# Patient Record
Sex: Male | Born: 1985 | Race: White | Hispanic: Yes | Marital: Single | Smoking: Current some day smoker
Health system: Southern US, Community
[De-identification: ages and names within clinical notes are randomized; demographics above are authoritative.]

## PROBLEM LIST (undated history)

## (undated) HISTORY — PX: TONSILLECTOMY: SUR1361

---

## 2019-03-11 ENCOUNTER — Emergency Department (HOSPITAL_COMMUNITY): Payer: No Typology Code available for payment source

## 2019-03-11 ENCOUNTER — Encounter (HOSPITAL_COMMUNITY): Payer: Self-pay | Admitting: Emergency Medicine

## 2019-03-11 ENCOUNTER — Observation Stay (HOSPITAL_COMMUNITY)
Admission: EM | Admit: 2019-03-11 | Discharge: 2019-03-12 | Disposition: A | Discharge status: Home / Self Care | Payer: No Typology Code available for payment source | Attending: Family Medicine | Admitting: Family Medicine

## 2019-03-11 DIAGNOSIS — J9601 Acute respiratory failure with hypoxia: Principal | ICD-10-CM | POA: Insufficient documentation

## 2019-03-11 DIAGNOSIS — F10129 Alcohol abuse with intoxication, unspecified: Secondary | ICD-10-CM | POA: Insufficient documentation

## 2019-03-11 DIAGNOSIS — F1410 Cocaine abuse, uncomplicated: Secondary | ICD-10-CM | POA: Insufficient documentation

## 2019-03-11 DIAGNOSIS — Y906 Blood alcohol level of 120-199 mg/100 ml: Secondary | ICD-10-CM | POA: Diagnosis not present

## 2019-03-11 DIAGNOSIS — R739 Hyperglycemia, unspecified: Secondary | ICD-10-CM | POA: Diagnosis not present

## 2019-03-11 DIAGNOSIS — Z20822 Contact with and (suspected) exposure to covid-19: Secondary | ICD-10-CM | POA: Diagnosis not present

## 2019-03-11 DIAGNOSIS — J690 Pneumonitis due to inhalation of food and vomit: Secondary | ICD-10-CM | POA: Diagnosis not present

## 2019-03-11 DIAGNOSIS — F10929 Alcohol use, unspecified with intoxication, unspecified: Secondary | ICD-10-CM

## 2019-03-11 DIAGNOSIS — R7989 Other specified abnormal findings of blood chemistry: Secondary | ICD-10-CM | POA: Diagnosis not present

## 2019-03-11 LAB — COMPREHENSIVE METABOLIC PANEL
ALT: 237 U/L — ABNORMAL HIGH (ref 0–44)
AST: 169 U/L — ABNORMAL HIGH (ref 15–41)
Albumin: 4.4 g/dL (ref 3.5–5.0)
Alkaline Phosphatase: 80 U/L (ref 38–126)
Anion gap: 17 — ABNORMAL HIGH (ref 5–15)
BUN: 9 mg/dL (ref 6–20)
CO2: 17 mmol/L — ABNORMAL LOW (ref 22–32)
Calcium: 8.9 mg/dL (ref 8.9–10.3)
Chloride: 106 mmol/L (ref 98–111)
Creatinine, Ser: 0.81 mg/dL (ref 0.61–1.24)
GFR calc Af Amer: >60 mL/min (ref >60)
GFR calc non Af Amer: >60 mL/min (ref >60)
Glucose, Bld: 198 mg/dL — ABNORMAL HIGH (ref 70–99)
Potassium: 3.5 mmol/L (ref 3.5–5.1)
Sodium: 140 mmol/L (ref 135–145)
Total Bilirubin: 0.7 mg/dL (ref 0.3–1.2)
Total Protein: 7.7 g/dL (ref 6.5–8.1)

## 2019-03-11 LAB — CBC
HCT: 45.2 % (ref 39.0–52.0)
Hemoglobin: 14.6 g/dL (ref 13.0–17.0)
MCH: 26.5 pg (ref 26.0–34.0)
MCHC: 32.3 g/dL (ref 30.0–36.0)
MCV: 82.2 fL (ref 80.0–100.0)
Platelets: 321 10*3/uL (ref 150–400)
RBC: 5.5 MIL/uL (ref 4.22–5.81)
RDW: 14.2 % (ref 11.5–15.5)
WBC: 14.2 10*3/uL — ABNORMAL HIGH (ref 4.0–10.5)
nRBC: 0 % (ref 0.0–0.2)

## 2019-03-11 LAB — URINALYSIS, ROUTINE W REFLEX MICROSCOPIC
Bilirubin Urine: NEGATIVE
Glucose, UA: 150 mg/dL — AB
Hgb urine dipstick: NEGATIVE
Ketones, ur: NEGATIVE mg/dL
Leukocytes,Ua: NEGATIVE
Nitrite: NEGATIVE
Protein, ur: NEGATIVE mg/dL
Specific Gravity, Urine: 1.008 (ref 1.005–1.030)
pH: 6 (ref 5.0–8.0)

## 2019-03-11 LAB — CDS SEROLOGY

## 2019-03-11 LAB — SAMPLE TO BLOOD BANK

## 2019-03-11 LAB — ETHANOL: Alcohol, Ethyl (B): 164 mg/dL — ABNORMAL HIGH (ref <10)

## 2019-03-11 MED ORDER — ONDANSETRON HCL 4 MG/2ML IJ SOLN
4.0000 mg | Freq: Once | INTRAMUSCULAR | Status: AC
  Administered 2019-03-11: 4 mg via INTRAVENOUS
  Filled 2019-03-11: qty 2

## 2019-03-11 MED ORDER — LORAZEPAM 2 MG/ML IJ SOLN
1.0000 mg | Freq: Once | INTRAMUSCULAR | Status: AC
  Administered 2019-03-11: 1 mg via INTRAVENOUS
  Filled 2019-03-11: qty 1

## 2019-03-11 MED ORDER — NALOXONE HCL 4 MG/10ML IJ SOLN
0.2500 mg/h | INTRAVENOUS | Status: DC
  Administered 2019-03-11: 0.5 mg/h via INTRAVENOUS
  Filled 2019-03-11: qty 10

## 2019-03-11 MED ORDER — SODIUM CHLORIDE 0.9 % IV BOLUS
1000.0000 mL | Freq: Once | INTRAVENOUS | Status: AC
  Administered 2019-03-11: 19:00:00 1000 mL via INTRAVENOUS

## 2019-03-11 MED ORDER — NALOXONE HCL 2 MG/2ML IJ SOSY
PREFILLED_SYRINGE | INTRAMUSCULAR | Status: AC
  Filled 2019-03-11: qty 2

## 2019-03-11 MED ORDER — IOHEXOL 300 MG/ML  SOLN
100.0000 mL | Freq: Once | INTRAMUSCULAR | Status: AC | PRN
  Administered 2019-03-11: 100 mL via INTRAVENOUS

## 2019-03-11 NOTE — ED Notes (Signed)
RT and pharmacy called to bedside, after narcan drip started, alert enough to maintain airway on NRB at 15L. Wife called to notify of presence in ED to encourage pt compliance, refused and would push away staff trying to provide care otherwise.
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
## 2019-03-11 NOTE — ED Notes (Signed)
Pt refused blood draw for blood bank until wife arrives

## 2019-03-11 NOTE — ED Notes (Signed)
Pt returned to NRB at 15, sat 89% on 4L [place], now 99%

## 2019-03-11 NOTE — ED Notes (Signed)
EDP at bedside, noted that pt not adequated potecting airway, narcan 2mg  ordered and given while EDP used bag/mask to oxygenate. Nasal trumpet placed. After narcan given, pt began to vomit, placed on side and 4mg  zofran given. Pt alert enough to assist with rolling and pull out nasal trumpet. Vomited x2 prior to and x3 after zofran.

## 2019-03-11 NOTE — ED Notes (Signed)
Per MD order Narcan drip held and Pt to be monitored.

## 2019-03-11 NOTE — ED Notes (Signed)
Still off floor for imaging

## 2019-03-12 ENCOUNTER — Ambulatory Visit (HOSPITAL_BASED_OUTPATIENT_CLINIC_OR_DEPARTMENT_OTHER): Payer: No Typology Code available for payment source

## 2019-03-12 ENCOUNTER — Other Ambulatory Visit: Payer: Self-pay

## 2019-03-12 ENCOUNTER — Encounter (HOSPITAL_COMMUNITY): Payer: Self-pay | Admitting: Internal Medicine

## 2019-03-12 DIAGNOSIS — J690 Pneumonitis due to inhalation of food and vomit: Secondary | ICD-10-CM

## 2019-03-12 DIAGNOSIS — R55 Syncope and collapse: Secondary | ICD-10-CM

## 2019-03-12 DIAGNOSIS — J9601 Acute respiratory failure with hypoxia: Principal | ICD-10-CM

## 2019-03-12 LAB — CBC
HCT: 41.7 % (ref 39.0–52.0)
Hemoglobin: 14 g/dL (ref 13.0–17.0)
MCH: 27.3 pg (ref 26.0–34.0)
MCHC: 33.6 g/dL (ref 30.0–36.0)
MCV: 81.3 fL (ref 80.0–100.0)
Platelets: 290 10*3/uL (ref 150–400)
RBC: 5.13 MIL/uL (ref 4.22–5.81)
RDW: 14.6 % (ref 11.5–15.5)
WBC: 20.5 10*3/uL — ABNORMAL HIGH (ref 4.0–10.5)
nRBC: 0 % (ref 0.0–0.2)

## 2019-03-12 LAB — I-STAT CHEM 8, ED
BUN: 9 mg/dL (ref 6–20)
Calcium, Ion: 1 mmol/L — ABNORMAL LOW (ref 1.15–1.40)
Chloride: 107 mmol/L (ref 98–111)
Creatinine, Ser: 1 mg/dL (ref 0.61–1.24)
Glucose, Bld: 208 mg/dL — ABNORMAL HIGH (ref 70–99)
HCT: 44 % (ref 39.0–52.0)
Hemoglobin: 15 g/dL (ref 13.0–17.0)
Potassium: 3.6 mmol/L (ref 3.5–5.1)
Sodium: 140 mmol/L (ref 135–145)
TCO2: 20 mmol/L — ABNORMAL LOW (ref 22–32)

## 2019-03-12 LAB — RAPID URINE DRUG SCREEN, HOSP PERFORMED
Amphetamines: NOT DETECTED
Barbiturates: NOT DETECTED
Benzodiazepines: NOT DETECTED
Cocaine: POSITIVE — AB
Opiates: NOT DETECTED
Tetrahydrocannabinol: NOT DETECTED

## 2019-03-12 LAB — TROPONIN I (HIGH SENSITIVITY)
Troponin I (High Sensitivity): 8 ng/L (ref <18)
Troponin I (High Sensitivity): 8 ng/L (ref <18)

## 2019-03-12 LAB — COMPREHENSIVE METABOLIC PANEL
ALT: 197 U/L — ABNORMAL HIGH (ref 0–44)
AST: 93 U/L — ABNORMAL HIGH (ref 15–41)
Albumin: 4.2 g/dL (ref 3.5–5.0)
Alkaline Phosphatase: 69 U/L (ref 38–126)
Anion gap: 14 (ref 5–15)
BUN: 9 mg/dL (ref 6–20)
CO2: 22 mmol/L (ref 22–32)
Calcium: 8.7 mg/dL — ABNORMAL LOW (ref 8.9–10.3)
Chloride: 103 mmol/L (ref 98–111)
Creatinine, Ser: 0.94 mg/dL (ref 0.61–1.24)
GFR calc Af Amer: >60 mL/min (ref >60)
GFR calc non Af Amer: >60 mL/min (ref >60)
Glucose, Bld: 137 mg/dL — ABNORMAL HIGH (ref 70–99)
Potassium: 4.2 mmol/L (ref 3.5–5.1)
Sodium: 139 mmol/L (ref 135–145)
Total Bilirubin: 0.8 mg/dL (ref 0.3–1.2)
Total Protein: 7.9 g/dL (ref 6.5–8.1)

## 2019-03-12 LAB — HEPATITIS PANEL, ACUTE
HCV Ab: NONREACTIVE
Hep A IgM: NONREACTIVE
Hep B C IgM: NONREACTIVE
Hepatitis B Surface Ag: NONREACTIVE

## 2019-03-12 LAB — HEMOGLOBIN A1C
Hgb A1c MFr Bld: 5.2 % (ref 4.8–5.6)
Mean Plasma Glucose: 102.54 mg/dL

## 2019-03-12 LAB — TSH: TSH: 0.591 u[IU]/mL (ref 0.350–4.500)

## 2019-03-12 LAB — ECHOCARDIOGRAM COMPLETE: Height: 68 in

## 2019-03-12 LAB — PHOSPHORUS: Phosphorus: 4.5 mg/dL (ref 2.5–4.6)

## 2019-03-12 LAB — MAGNESIUM
Magnesium: 1.6 mg/dL — ABNORMAL LOW (ref 1.7–2.4)
Magnesium: 2.4 mg/dL (ref 1.7–2.4)

## 2019-03-12 LAB — RESPIRATORY PANEL BY RT PCR (FLU A&B, COVID)
Influenza A by PCR: NEGATIVE
Influenza B by PCR: NEGATIVE
SARS Coronavirus 2 by RT PCR: NEGATIVE

## 2019-03-12 LAB — HIV ANTIBODY (ROUTINE TESTING W REFLEX): HIV Screen 4th Generation wRfx: NONREACTIVE

## 2019-03-12 MED ORDER — LORAZEPAM 2 MG/ML IJ SOLN: 1.0000 mg | INTRAMUSCULAR | Status: DC | PRN

## 2019-03-12 MED ORDER — ADULT MULTIVITAMIN W/MINERALS CH
1.0000 | ORAL_TABLET | Freq: Every day | ORAL | Status: DC
  Administered 2019-03-12: 1 via ORAL
  Filled 2019-03-12: qty 1

## 2019-03-12 MED ORDER — SODIUM CHLORIDE 0.9 % IV SOLN
1.5000 g | Freq: Four times a day (QID) | INTRAVENOUS | Status: DC
  Administered 2019-03-12 (×2): 1.5 g via INTRAVENOUS
  Filled 2019-03-12 (×2): qty 4
  Filled 2019-03-12: qty 1.5
  Filled 2019-03-12 (×2): qty 4

## 2019-03-12 MED ORDER — AMOXICILLIN-POT CLAVULANATE 875-125 MG PO TABS: 1.0000 | ORAL_TABLET | Freq: Two times a day (BID) | ORAL | 0 refills | Status: AC

## 2019-03-12 MED ORDER — ACETAMINOPHEN 650 MG RE SUPP: 650.0000 mg | Freq: Four times a day (QID) | RECTAL | Status: DC | PRN

## 2019-03-12 MED ORDER — ACETAMINOPHEN 325 MG PO TABS
650.0000 mg | ORAL_TABLET | Freq: Four times a day (QID) | ORAL | Status: DC | PRN
  Administered 2019-03-12: 650 mg via ORAL
  Filled 2019-03-12 (×2): qty 2

## 2019-03-12 MED ORDER — THIAMINE HCL 100 MG PO TABS: 100.0000 mg | ORAL_TABLET | Freq: Every day | ORAL | 1 refills | Status: AC

## 2019-03-12 MED ORDER — FOLIC ACID 1 MG PO TABS
1.0000 mg | ORAL_TABLET | Freq: Every day | ORAL | Status: DC
  Administered 2019-03-12: 1 mg via ORAL
  Filled 2019-03-12: qty 1

## 2019-03-12 MED ORDER — THIAMINE HCL 100 MG PO TABS
100.0000 mg | ORAL_TABLET | Freq: Every day | ORAL | Status: DC
  Administered 2019-03-12: 100 mg via ORAL
  Filled 2019-03-12: qty 1

## 2019-03-12 MED ORDER — MAGNESIUM SULFATE 2 GM/50ML IV SOLN
2.0000 g | Freq: Once | INTRAVENOUS | Status: AC
  Administered 2019-03-12: 2 g via INTRAVENOUS
  Filled 2019-03-12: qty 50

## 2019-03-12 MED ORDER — THIAMINE HCL 100 MG/ML IJ SOLN: 100.0000 mg | Freq: Every day | INTRAMUSCULAR | Status: DC

## 2019-03-12 MED ORDER — LORAZEPAM 1 MG PO TABS
1.0000 mg | ORAL_TABLET | ORAL | Status: DC | PRN
  Administered 2019-03-12: 1 mg via ORAL
  Filled 2019-03-12: qty 1

## 2019-03-12 MED ORDER — ALBUTEROL SULFATE HFA 108 (90 BASE) MCG/ACT IN AERS: 2.0000 | INHALATION_SPRAY | Freq: Four times a day (QID) | RESPIRATORY_TRACT | Status: DC | PRN
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
## 2019-03-12 NOTE — Progress Notes (Signed)
  Echocardiogram 2D Echocardiogram has been performed.  Celene Skeen 03/12/2019, 11:36 AM

## 2019-03-12 NOTE — ED Notes (Signed)
Patient's SPO2 was noted at 88 with good pleth. 2 liters O2 nasal cannula applied

## 2019-03-12 NOTE — ED Notes (Signed)
Patient called out complaining of pain at the IV site in the right [facility] where magnesium was being administered. Site was bruised and painful. IV was removed and the second IV site was used to administer the medication. The patient called out again stating he was having the same pain. A bubble was noted at the IV site and the IV was removed. Another IV was initiated to complete the therapy.

## 2019-03-12 NOTE — ED Notes (Signed)
Pt requesting resources for alcohol treatment. Pt states he drinks 2 cases of beer a day when he is " really drinking" CIWA assessment 8
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
## 2019-03-12 NOTE — ED Notes (Signed)
MD at bedside. Instructed to turn O2 off. Pt maintained 100% SpO2

## 2019-03-12 NOTE — Discharge Instructions (Signed)
Please reach out to one of the following agencies to request assistance with alcohol detox withdrawal and detox.  [facility] [phone]  [facility] [address] [place] [address] [phone]   [facility] [address] [phone]   Neumona por aspiracin Aspiration Pneumonia La neumona por aspiracin es una infeccin en los pulmones. Ocurre cuando se inhala (se aspira) saliva o lquido contaminados con bacterias hacia los pulmones. Cuando estas sustancias llegan a los pulmones, puede producirse hinchazn (inflamacin) e infeccin. Esto puede causar dificultad para respirar. La neumona por aspiracin es una afeccin grave que puede ser potencialmente mortal. Cules son las causas? Esta afeccin ocurre cuando se inhala saliva o lquido de la boca, de la garganta o del Nash-Finch Company, y cuando estos fluidos estn contaminados con bacterias. Qu incrementa el riesgo? Los siguientes factores pueden hacer que usted sea ms propenso a Warehouse manager esta afeccin:  Un estrechamiento en el conducto que transporta el alimento al estmago (estrechamiento esofgico).  Tener la enfermedad por reflujo gastroesofgico (ERGE).  Tener un sistema inmunitario debilitado.  Tener diabetes.  Tener una higiene bucal deficiente.  Estar desnutrido. Es ms probable que esta afeccin se produzca cuando hayan disminuido el reflejo de la tos (reflejo nauseoso) o la capacidad para tragar. Algunos factores que pueden causar esta disminucin incluyen los siguientes:  Tener una lesin cerebral o una enfermedad como accidente cerebrovascular, convulsiones, enfermedad de Parkinson, demencia o esclerosis lateral amiotrfica (ELA).  Recibir anestesia general por un procedimiento.  Beber alcohol en exceso. Si una persona se desmaya y vomita, el vmito se puede inhalar hacia los pulmones.  Tomar ciertos  medicamentos, como tranquilizantes o sedantes. Cules son los signos o los sntomas? Los sntomas de esta afeccin Baxter International siguientes:  [place].  Tos con secreciones que puedan ser [address], marrones o verdes.  Problemas respiratorios, como sibilancias o falta de aire.  Dolor en el pecho.  Sentir ms cansancio que lo habitual (fatiga).  Tener antecedentes de tos al comer o beber.  Mal aliento.  Color azulado en los labios, la piel o los dedos. Cmo se diagnostica? Esta afeccin se puede diagnosticar en funcin de lo siguiente:  Un examen fsico.  Estudios, como por ejemplo: ? Radiografa de trax. ? Cultivo de esputo. La saliva y la mucosidad (esputo) se Software engineer de los pulmones o de los conductos que llevan el aire a los pulmones (bronquios). Luego se buscan bacterias en el esputo. ? Oximetra. Se coloca un sensor o clip en reas como el dedo de la mano, el lbulo o el dedo del pie para medir el nivel de oxgeno en la [place]. ? Anlisis de [place]. ? Estudios de deglucin. En este estudio, se observa cmo se traga el alimento y si va hacia el conducto respiratorio (trquea) o al esfago. ? Broncoscopia. En este estudio, se [place] un tubo flexible (broncoscopio) para observar el interior de los pulmones. Cmo se trata? El tratamiento de esta afeccin puede incluir lo siguiente:  Medicamentos. Antibiticos administrados para eliminar la bacteria de la neumona. Tambin podrn indicarle otros medicamentos para disminuir la fiebre o Chief Technology Officer.  Asistencia respiratoria y oxigenoterapia. Segn qu tan bien respire, es posible que le administren oxgeno o que necesite la [place] de un dispositivo para Industrial/product designer (respirador).  Toracocentesis. Este es un procedimiento para extraer el lquido acumulado en el espacio que se encuentra entre las membranas de la pared torcica y los pulmones.  Sonda de alimentacin y cambios en la alimentacin. A las personas que tienen  dificultades para tragar podrn colocarle una sonda de alimentacin en el estmago, o bien podrn indicarle que eviten ciertas texturas en los alimentos o ciertos lquidos al comer. Siga estas indicaciones en su casa: Medicamentos  Delphi de venta libre y los recetados solamente como se lo haya indicado el mdico. ? Si le recetaron un antibitico, tmelo como se lo haya indicado el mdico. No deje de tomar los antibiticos aunque comience a Sports administrator. ? Delphi para la tos solamente si no puede dormir. Los medicamentos para la tos pueden obstaculizar la capacidad natural del organismo para eliminar la mucosidad de los pulmones. Instrucciones generales  Siga cuidadosamente todas las indicaciones relacionadas con la alimentacin, como evitar ciertas texturas de alimentos o espesar los lquidos. Espesar los lquidos reduce el riesgo de volver a sufrir una neumona por aspiracin.  Realice ejercicios de respiracin, como drenaje postural, respiracin profunda y espirometra incentiva, para expulsar las secreciones.  Haga reposo como se lo haya indicado el mdico.  De noche, duerma semisentado. Intente dormir en un silln reclinable o colquese algunas almohadas debajo de la cabeza.  No consuma ningn producto que contenga nicotina o tabaco, como cigarrillos y Psychologist, sport and exercise. Si necesita ayuda para dejar de fumar, consulte al mdico.  Concurra a todas las visitas de control como se lo haya indicado el mdico. Esto es importante. Comunquese con un mdico si:  Tiene fiebre.  La tos empeora con secreciones amarillentas, marrones o verdes.  Tiene tos al comer o beber. Solicite ayuda de inmediato si:  [place]ina dificultad para Ambulance person.  Siente dolor en el pecho. Resumen  La neumona por aspiracin es una infeccin en los pulmones. Es causada cuando se inhala saliva o lquido de la boca, de la garganta o del  [facility].  La neumona por aspiracin es ms probable que ocurra cuando hayan disminuido el reflejo de la tos o la capacidad para tragar.  Algunos sntomas de la neumona por aspiracin incluyen tos, problemas para respirar, fiebre y Tourist information centre manager.  La neumona por aspiracin puede tratarse con antibiticos, otros medicamentos para reducir Conservation officer, historic buildings o la fiebre y asistencia respiratoria u oxigenoterapia. Esta informacin no tiene Marine scientist el consejo del mdico. Asegrese de hacerle al mdico cualquier pregunta que tenga. Document Revised: 08/05/2016 Document Reviewed: 08/05/2016 Elsevier Patient Education  [place] por Ardelia Mems colisin entre vehculos motorizados en adultos Technical brewer Injury, Adult Despus de un accidente automovilstico (colisin entre vehculos motorizados), es comn tener lesiones en la cabeza, el [place], los brazos y el cuerpo. Estas lesiones pueden incluir:  Cortes.  Quemaduras.  Moretones.  Dolores musculares o una distensin o un desgarro en un msculo (esguince).  Dolores de [place]. Es posible que se sienta rgido y dolorido durante las primeras horas. Puede sentirse peor despus de despertarse la primera maana despus del accidente. Las BlueLinx y Conservation officer, historic buildings causados por estas lesiones suelen ser [employer] las primeras 24 a 48 horas. Despus de eso, comenzar a Charity fundraiser. La rapidez con la que mejore a menudo depende de lo siguiente:  La gravedad del accidente.  La cantidad de lesiones que tiene.  Dnde se encuentran sus lesiones.  Qu tipos de lesiones tiene.  Si estaba usando el cinturn de seguridad.  Si se [place] el airbag. Una lesin en la  cabeza puede dar lugar a una conmocin cerebral. Este es un tipo de lesin cerebral que puede tener Vassarefectos graves. Si tiene una conmocin cerebral, debe hacer reposo como se lo haya indicado el mdico. Debe tener mucho cuidado de  evitar una segunda conmocin cerebral. Siga estas instrucciones en su casa: Medicamentos  Use los medicamentos de venta libre y los recetados solamente como se lo haya indicado el mdico.  Si le recetaron un antibitico, tmelo o aplqueselo como se lo haya indicado el mdico. No deje de usar el antibitico aunque la afeccin mejore. Si tiene una herida o una quemadura:   Limpie su herida o quemadura como le indic el mdico. ? Lave con agua y Gang Millsjabn suave. ? Enjuague con agua para quitar todo el jabn. ? Seque dando palmaditas con un pao limpio y seco. No la frote. ? Si le indicaron que ponga un ungento o una crema en la herida, hgalo como se lo haya indicado el mdico.  Siga las instrucciones del mdico en lo que respecta al cuidado de la herida o [place]emadura. Asegrese de hacer lo siguiente: ? Sepa cmo y cundo cambiarse o quitarse las vendas (vendajes). ? Siempre lvese las manos con agua y Belarusjabn antes y despus de cambiarse la venda. Use un desinfectante para manos si no dispone de Franceagua y Belarusjabn. ? Si tiene colocados puntos (suturas), goma para cerrar la piel o tiras de cinta (adhesiva) para la piel, no se los quite. Tal vez deban dejarse puestos en la piel durante 2semanas o ms. Si las tiras Simpsonadhesivas se despegan y se enroscan, puede recortar los bordes sueltos. No retire las tiras Agilent Technologiesadhesivas por completo a menos que el mdico lo autorice.  No: ? No se rasque ni se toque la herida o Lao People's Democratic Republicquemadura. ? Reviente las ampollas que se puedan haber formado. ? No se arranque la piel.  Evite exponer la herida o quemadura a la luz del sol.  Cuando est sentado o acostado, eleve la zona de la herida o Lao People's Democratic Republicquemadura por encima del nivel del corazn. Si tiene una herida o quemadura en el rostro, se le recomienda dormir con la cabeza elevada. Puede colocar una almohada extra debajo de la cabeza.  Controle la herida o quemadura todos los [id number] para detectar signos de infeccin. Est atento a los  siguientes signos: ? Aumento del enrojecimiento, la hinchazn o Chief Technology Officerel dolor. ? Ms lquido Arcola Janskyo sangre. ? Calor. ? Pus o mal olor. Actividad  Reposo. El descanso ayuda a su cuerpo a Barrister's clerksanar. Asegrese de hacer lo siguiente: ? Duerma bien por la noche. Evite quedarse despierto Pepco Holdingshasta muy tarde. ? Vyase a dormir a la VF Corporationmisma hora los das de [address]man[address] y los fines de Newrysemana.  Pregntele al mdico si tiene algn lmite en lo que Furniture conservator/restorerpuede levantar.  Consulte a su mdico cundo Merchandiser, retailpuede conducir, Lobbyistandar en bicicleta o usar maquinaria pesada. No realice estas actividades si se siente mareado.  Si le indican que use un dispositivo ortopdico en un brazo, una pierna u otra parte de su cuerpo lesionados, siga las instrucciones del mdico respecto de Stephensonlas actividades. El mdico puede darle instrucciones con respecto a Science writerconducir, baarse, hacer ejercicio o trabajar. Instrucciones generales      Si se lo indican, aplique hielo sobre la zona lesionada. ? Ponga el hielo en una bolsa plstica. ? Coloque una FirstEnergy Corptoalla entre la piel y [place] bolsa. ? Aplique el hielo durante 20minutos, 2 a 3veces por da.  Beba suficiente lquido para Radio producermantener el pis (laorina) de  color amarillo plido.  No beba alcohol.  Consuma alimentos saludables.  Concurra a todas las visitas de [address] se lo haya indicado el mdico. Esto es importante. Comunquese con un mdico si:  Sus sntomas empeoran.  Tiene dolor en el cuello que empeora o que no mejora despus de 1 semana.  Tiene signos de infeccin en una herida o [place].  Tiene fiebre.  Tiene alguno de los siguientes sntomas durante ms de 2 semanas despus del accidente automovilstico: ? Dolores de cabeza que perduran (crnicos). ? Mareos o problemas de equilibrio. ? Ganas de vomitar (nuseas). ? Problemas de la vista (visin). ? Ms sensibilidad a la luz o los ruidos. ? Depresin y Mattel de nimo. ? Estar preocupado o nervioso (ansioso). ? Enojarse o  molestarse fcilmente. ? Problemas de memoria. ? Dificultad para prestar atencin o concentrarse. ? Problemas para dormir. ? RadioShack. Solicite ayuda inmediatamente si:  Tiene lo siguiente: ? Prdida de la sensibilidad (adormecimiento), hormigueo o debilidad en los brazos o las piernas. ? Dolor muy fuerte en el cuello, especialmente dolor a la palpacin en el centro de la nuca. ? Cambios en la capacidad de controlar el pis o las deposiciones (heces). ? Aumento del dolor en cualquier parte del cuerpo. ? Hinchazn en cualquier parte del cuerpo, especialmente las piernas. ? Falta de aire o sensacin de desvanecimiento. ? Dolor en el pecho. ? Sangre en el pis, las deposiciones o el vmito. ? Dolor muy fuerte en el vientre (abdomen) o en la espalda. ? Dolores de [place] fuertes o dolores de cabeza que [place]. ? Prdida repentina de la visin o visin doble.  El ojo se enrojece repentinamente.  El centro negro del ojo (pupila) tiene una forma o un tamao extraos. Resumen  Despus de un accidente automovilstico (colisin entre vehculos motorizados), es comn tener lesiones en la cabeza, el [place], los brazos y el cuerpo.  Siga las instrucciones del mdico en lo que respecta al cuidado de [place] herida o [place].  Si se lo indican, aplique hielo sobre las zonas lesionadas.  Comunquese con el mdico si los sntomas empeoran.  Concurra a todas las visitas de seguimiento como se lo haya indicado el mdico. Esta informacin no tiene Theme park manager el consejo del mdico. Asegrese de hacerle al mdico cualquier pregunta que tenga. Document Revised: 06/05/2018 Document Reviewed: 06/05/2018 Elsevier Patient Education  2020 ArvinMeritor.  La recuperacin de una adiccin Recovering From Addiction La adiccin es una enfermedad cerebral compleja. La adiccin produce una necesidad incontrolable (compulsiva) de una sustancia. Se puede ser adicto a sustancias tales como el  alcohol, el tabaco, las drogas o medicamentos recetados, Con-way. La adiccin puede modificar la forma en la que funciona el cerebro. Afecta la memoria, el comportamiento y la [place] de tomar decisiones. Sin tratamiento, la Armed forces training and education officer. No obstante, con tratamiento y [address] en el estilo de vida, es posible recuperarse de una adiccin. Qu tipos de tratamiento hay disponibles? El programa de tratamiento adecuado para cada caso depender de muchos factores, incluido el tipo de adiccin que usted tenga. Los programas de tratamiento pueden ser ambulatorios o con hospitalizacin. En un programa ambulatorio, la persona vive en su casa y va a la escuela o al Aleen Campi, pero tambin va a una clnica para recibir TEFL teacher. En un programa con hospitalizacin, la persona vive y duerme en el centro donde recibe el tratamiento. Algunas otras opciones de [place], son las siguientes:  Medicamentos. ? Algunas adicciones  pueden tratarse con medicamentos recetados. ? Tambin es posible que se necesiten medicamentos para tratar otras afecciones de salud mental tales como la ansiedad y la depresin.  Psicoterapia y terapia conductual. La terapia puede ayudarle a aprender nuevas formas de responder a situaciones que provocan estrs o pueden inducirlo a consumir la sustancia adictiva.  Grupos de apoyo. Estos incluyen grupos de terapia y [place] de 12 pasos. Pueden ser de ayuda para las personas con la adiccin y sus familias durante el tratamiento y Research scientist (physical sciences). Algunos de los programas de 12 pasos son Alcohlicos Annimos (AA) y Narcticos Annimos (NA). Cmo manejar los cambios en el estilo de vida Manejar el estrs Demasiado estrs puede llevarlo nuevamente a la adiccin (tener una recada). Es necesario Clinical research associate formas eficaces de Dealer. Algunas tcnicas para lidiar con el estrs son las siguientes:  Meditacin, yoga o respiracin profunda.  Actividad fsica. Cree  una rutina de actividad fsica que disfrute y que le permita liberar algo de [place].  Crear o Optometrist.  Ejercicios de Merrill Lynch.  Medicamentos Algunos medicamentos pueden ayudar a sentirse ms tranquilo y a Warehouse manager menos deseos intensos de consumir. Si su mdico le receta medicamentos, asegrese de hacer lo siguiente:  Evite consumir alcohol y otras sustancias que puedan interferir con el funcionamiento adecuado de sus medicamentos (interaccin).  Hable con su farmacutico o mdico sobre todos los Chesapeake Energy toma, los posibles problemas que pueden causar (efectos secundarios) y los medicamentos que son seguros de tomar al Arrow Electronics.  Sea parte de todas las decisiones acerca de su tratamiento (toma de decisiones compartida). Pregunte sobre los posibles efectos secundarios de los medicamentos que el mdico le recomiende y cuntele cmo se siente al [place]. Es mejor si comparte las decisiones con su mdico como parte de su tratamiento general. Las relaciones Las relaciones de apoyo son muy importantes en la recuperacin. Cuando una persona se recupera de su adiccin a las drogas, es importante que evite rodearse de personas que consumen drogas. Para muchas personas, esto implica forjar relaciones nuevas y [place]. Algunas maneras de hacer esto pueden ser las siguientes:  Midwife relaciones de confianza con las personas que se conocen en el tratamiento o en AA o [place]. Esas personas comparten el deseo de dejar de consumir sustancias (ponerse sobrias) y mantenerse sobrias.  Tener un patrocinador como principal persona de apoyo si concurre a un programa de 12 pasos.  Construir relaciones con McGraw-Hill se conocen a travs de actividades tales como pasatiempos, trabajo voluntario o ejercicio fsico. Cmo reconocer cambios en el trastorno [place] se recupera de una adiccin, es muy comn que una persona tenga una recada y vuelva a consumir la sustancia.  Comunquese con su patrocinador, terapeuta o mdico para pedirle ms ayuda si experimenta alguno de los siguientes sntomas:  Ansiedad.  Karna Dupes.  Se asla de los dems.  Dificultad para dormir.  Se siente deprimido.  Prdida del apetito.  Tiene fantasas sobre Chesapeake Energy sustancia. Dnde encontrar apoyo Hablar con otras personas  Es posible que le aconsejen consultar con un terapeuta familiar y concurrir con miembros de su familia. La terapia familiar puede ayudarles a usted y su familia a comprender qu lo condujo a la adiccin. Hable con su familia sobre este abordaje.  Dgales a sus familiares o amigos que pueden Pension scheme manager. El apoyo de los seres queridos ser importante para que pueda State Street Corporation cambios positivos. Recursos financieros Asegrese de Science writer con su agente de  seguros qu opciones de tratamiento cubre su plan. Tambin es posible que encuentre asistencia financiera a travs de organizaciones sin fines de Visual merchandiserlucro o recursos gubernamentales locales. Si toma medicamentos, puede obtener la forma genrica, que es mucho menos costosa que un medicamento de [place]marca. Algunos laboratorios de medicamentos recetados tambin ofrecen ayuda a los pacientes que no pueden costearse los medicamentos que necesitan. Siga estas indicaciones en su casa:  Tome los medicamentos de venta libre y los recetados solamente como se lo haya indicado el mdico.  Surveyor, miningermanezca en el tratamiento hasta completar el Keeler Farmprograma. Asuma un papel activo en su tratamiento y su propio cuidado fsico y emocional. Desarrolle un plan de seguimiento.  Concurra a todas las visitas de control como se lo haya indicado el mdico y Training and development officerel terapeuta. Esto es importante.  Mantenga una dieta saludable, haga ejercicio con regularidad y duerma lo suficiente. Preguntas para hacerle al mdico  Si toma medicamentos: ? Durante cunto tiempo tendr TXU Corpque tomar los medicamentos? ? Tienen efectos secundarios a  largo plazo los medicamentos que tomo? ? Hay otras opciones en lugar de tomar medicamentos?  Me ser til la terapia?  Con qu frecuencia debo visitar a un mdico para el seguimiento? Comunquese con un mdico si:  Siente que puede tener una recada.  Dej de Golden West Financialtomar los medicamentos. Solicite ayuda de inmediato si:  Tiene pensamientos serios acerca de lastimarse a usted mismo o daar a Economistotras personas. Si alguna vez siente que puede lastimarse o Physicist, medicallastimar a los dems, o tiene pensamientos de poner fin a su vida, busque ayuda de inmediato. Puede dirigirse al servicio de emergencias ms cercano o comunicarse con:  El servicio de Marine scientistemergencias local (911 en los [place]).  Una lnea de asistencia al suicida y Visual merchandiseratencin en crisis, como la Murphy OilLnea Nacional de Prevencin del Suicidio (National Suicide Prevention Lifeline) al [phone]. Est disponible las 24 horas del da. Resumen  Con tratamiento y Danaher Corporationcambios en el estilo de vida, es posible recuperarse de una adiccin a sustancias tales como el alcohol, el tabaco, las drogas o medicamentos recetados, Con-waycomo los analgsicos.  Busque maneras eficaces de Toll Brothersmanejar el estrs para evitar una recada. Algunas tcnicas para lidiar con el estrs son la actividad fsica, meditacin, yoga y respiracin profunda.  Dgales a sus seres queridos que su apoyo es importante para su recuperacin.  Si tiene signos de que podra tener una recada, comunquese con su patrocinador del programa de 12 pasos, terapeuta o mdico y Honeywellsolictele ayuda. Esta informacin no tiene Theme park managercomo fin reemplazar el consejo del mdico. Asegrese de hacerle al mdico cualquier pregunta que tenga. Document Revised: 10/26/2016 Document Reviewed: 10/26/2016 Elsevier Patient Education  2020 ArvinMeritorElsevier Inc.

## 2019-03-12 NOTE — ED Notes (Signed)
Jello Ordered per pt request. Pt states she does not eat the breakfast that is served here

## 2019-03-12 NOTE — Progress Notes (Signed)
CSW received consult for patient to assist with resources for alcohol detox. CSW provided patient with resources - RTS of  and ADS of [facility].  Edwin Dada, MSW, LCSW-A Transitions of Care  Clinical Social Worker  [facility] Emergency Departments  Medical ICU [phone]

## 2019-03-17 LAB — CULTURE, BLOOD (ROUTINE X 2)
Culture: NO GROWTH
Culture: NO GROWTH
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
